# Patient Record
Sex: Female | Born: 1960 | Race: White | Hispanic: No | Marital: Married
Health system: Southern US, Community
[De-identification: ages and names within clinical notes are randomized; demographics above are authoritative.]

---

## 2004-05-06 ENCOUNTER — Ambulatory Visit: Payer: Self-pay

## 2004-05-27 ENCOUNTER — Ambulatory Visit: Payer: Self-pay | Admitting: Unknown Physician Specialty

## 2005-04-15 ENCOUNTER — Ambulatory Visit: Payer: Self-pay | Admitting: Unknown Physician Specialty

## 2005-05-28 ENCOUNTER — Ambulatory Visit: Payer: Self-pay | Admitting: Unknown Physician Specialty

## 2006-03-21 IMAGING — US US PELV - US TRANSVAGINAL
1 series · 17 of 25 positions shown · non-contrast
Comparison: none

REASON FOR EXAM: Globular uterus
COMMENTS:

[Series 1: us pelv - us transvaginal · 17 of 26 slices shown]
[im 1/26]
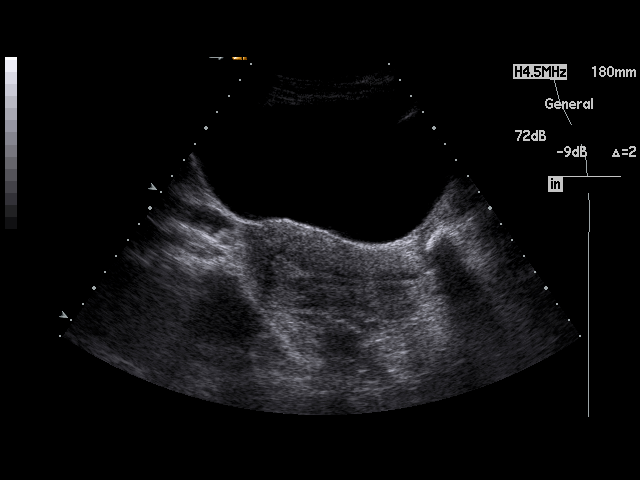
[im 3/26]
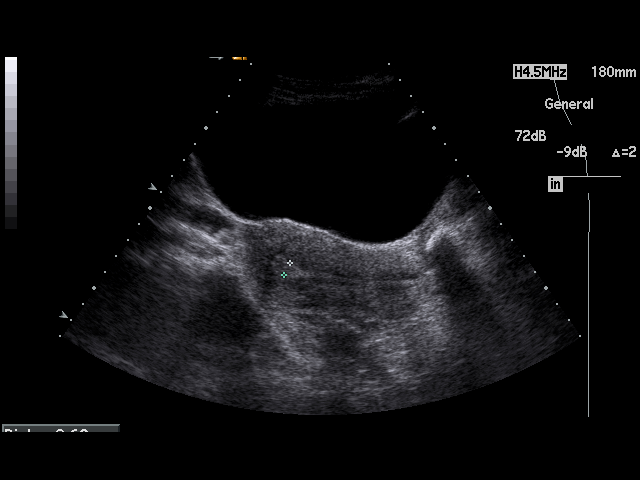
[im 4/26]
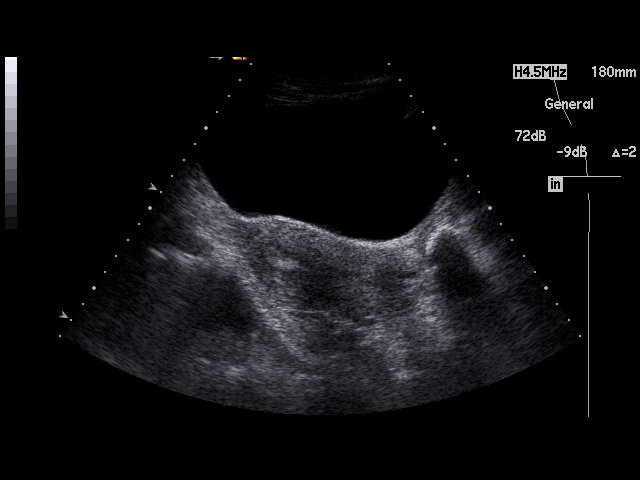
[im 6/26]
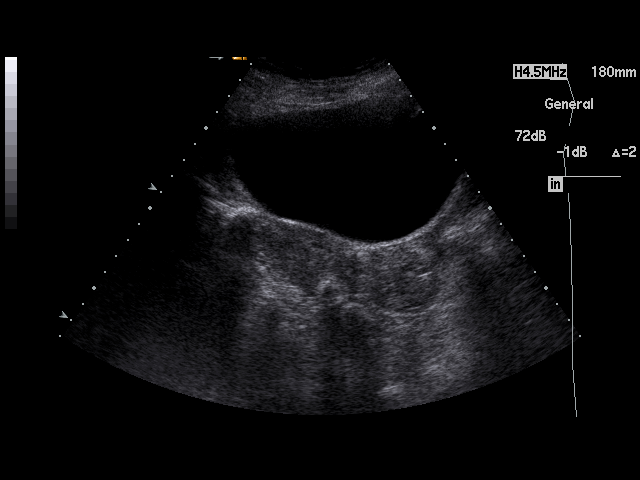
[im 7/26]
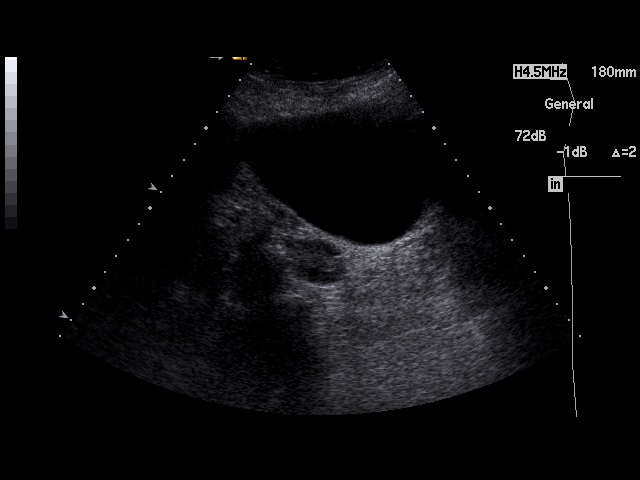
[im 9/26]
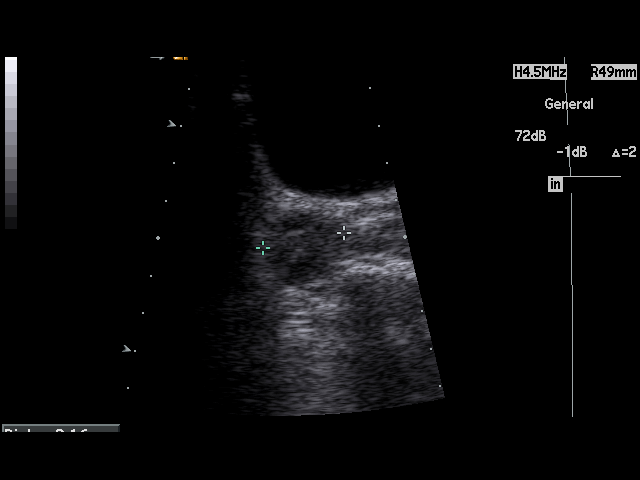
[im 10/26]
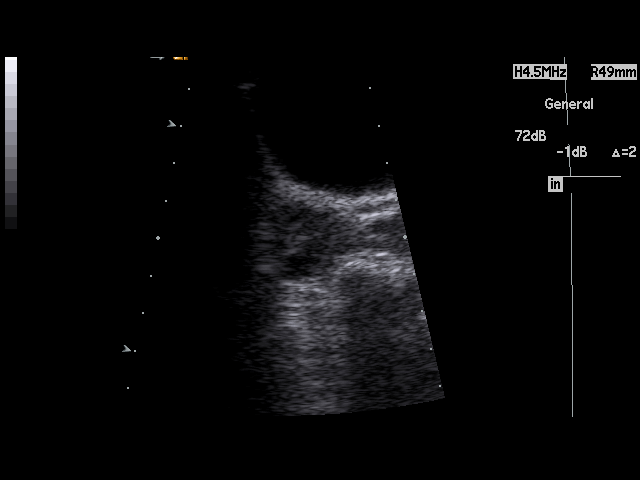
[im 12/26]
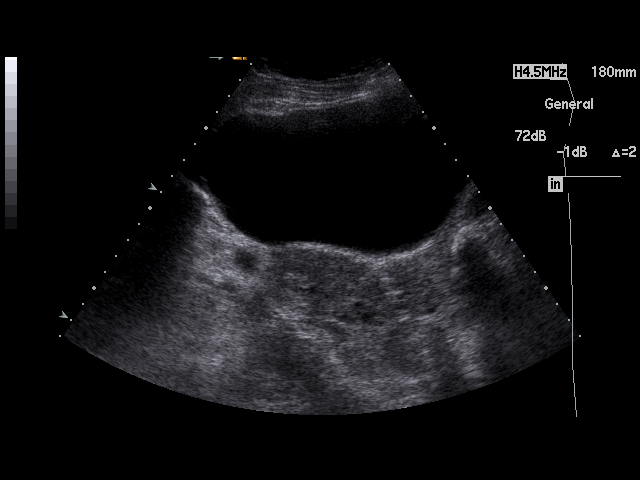
[im 13/26]
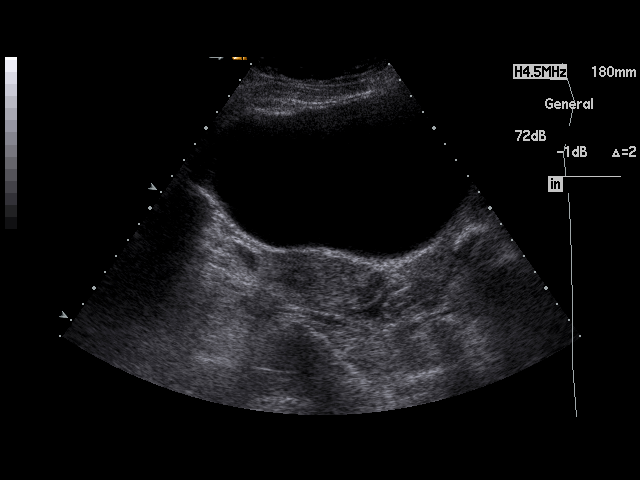
[im 14/26]
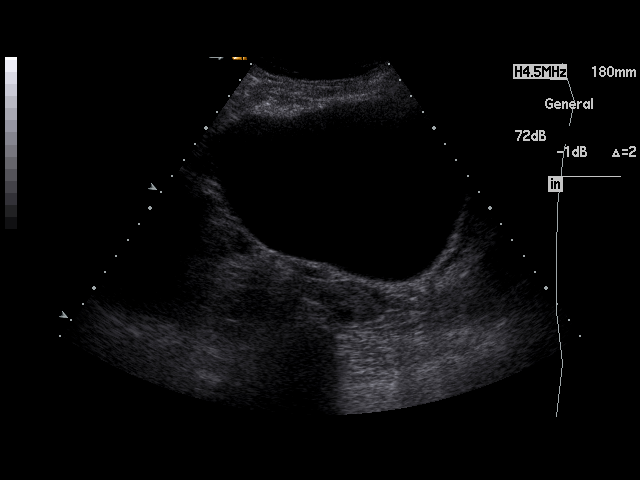
[im 16/26]
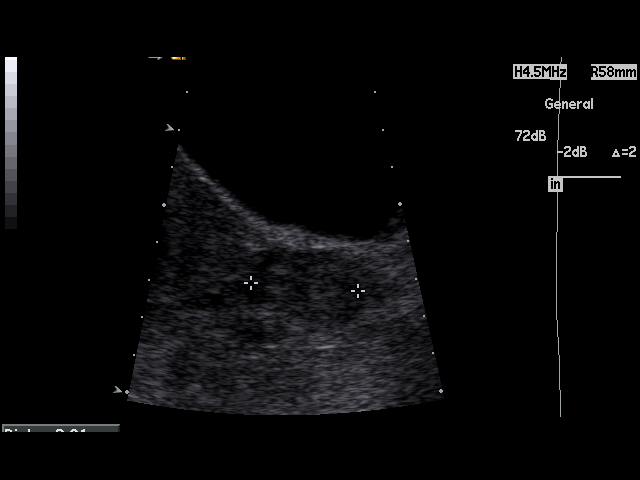
[im 17/26]
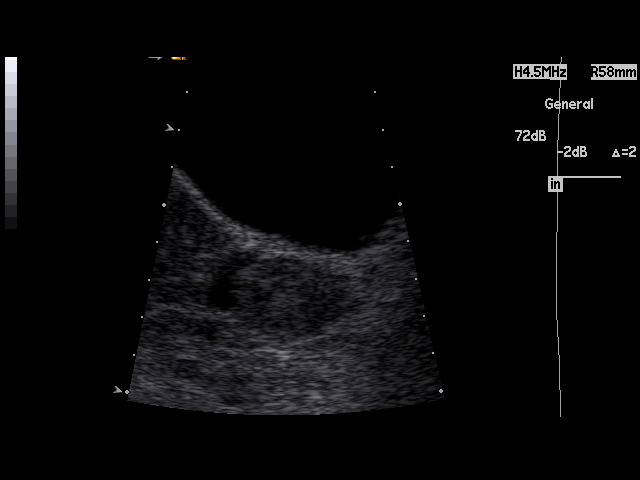
[im 19/26]
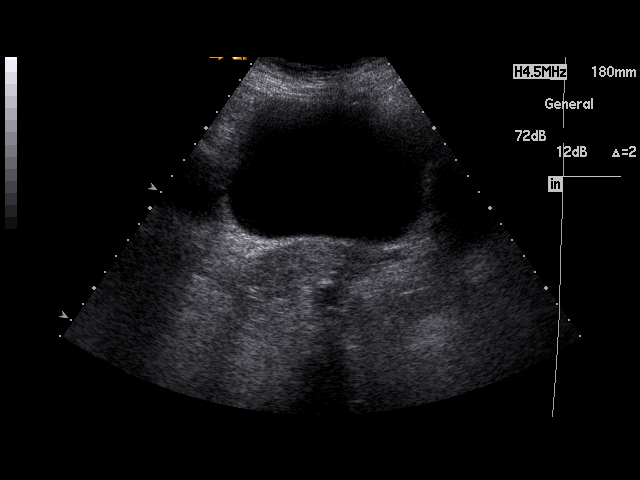
[im 20/26]
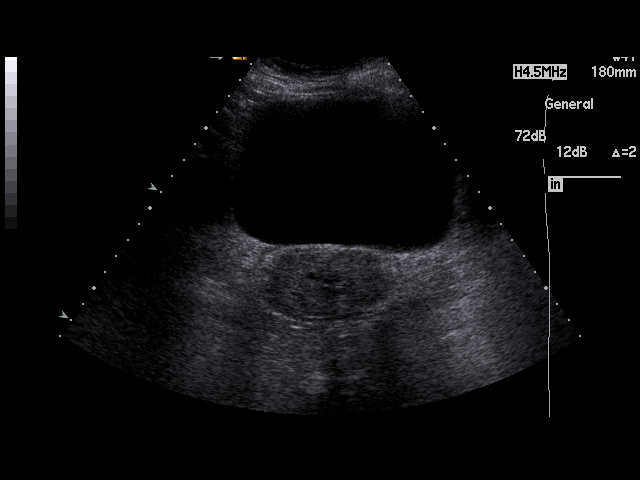
[im 22/26]
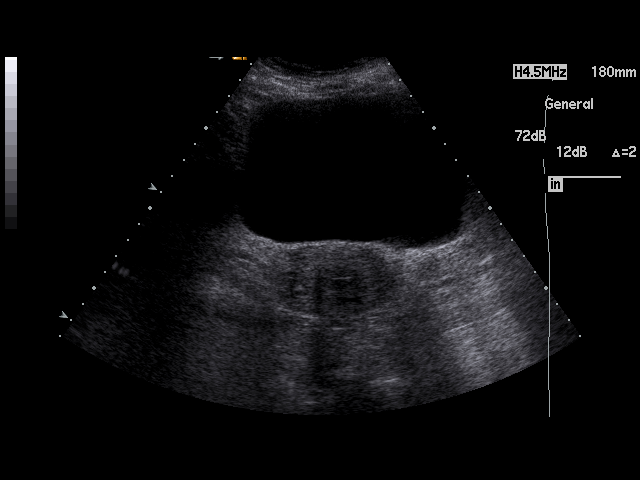
[im 23/26]
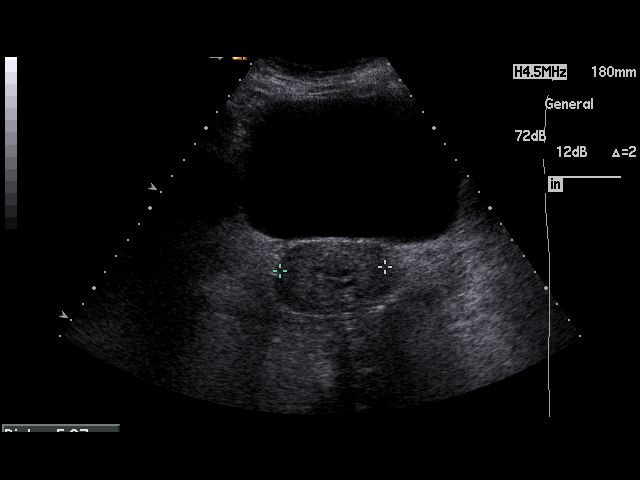
[im 26/26]
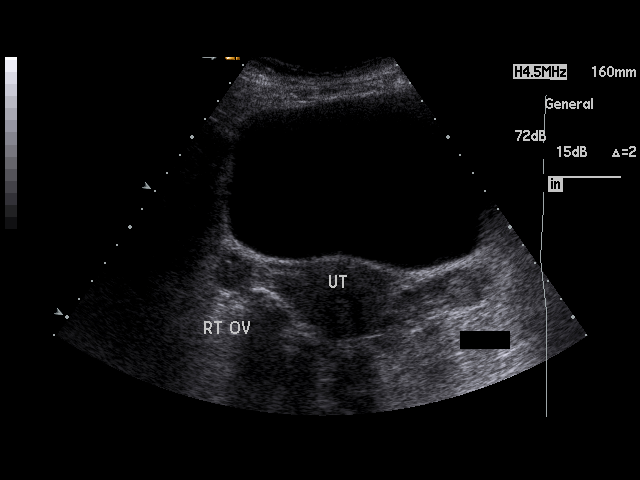

[17 of 25 positions shown; findings below may reference images not displayed]

PROCEDURE:     US  - US PELVIS MASS EXAM  - [DATE] [DATE] [DATE]  [DATE]

RESULT:          The uterus is normal in echotexture and measures
approximately 9 x 4.1 x 5.4 cm.  The endometrial stripe measures
approximately 7 mm.  No abnormal endometrial fluid collections are
identified.  There is no free fluid in the cul-de-sac.

The RIGHT ovary is normal in echotexture and measures 2.8 cm in greatest
dimension.  The LEFT ovary measures approximately 4 cm in greatest dimension
and exhibits developing follicles.
IMPRESSION: The uterus is mildly prominent in size for age, but no
uterine mass is seen.  No abnormal endometrial fluid collections are seen
either.

## 2006-04-26 ENCOUNTER — Ambulatory Visit: Payer: Self-pay

## 2007-04-01 IMAGING — US ULTRASOUND LEFT BREAST
1 series · 11 of 11 positions shown · non-contrast
Comparison: none

REASON FOR EXAM: Left breast mass from areola at 8 o'clock measuring
cm, firm and mobile
COMMENTS:

PROCEDURE:     US  - US BREAST LEFT  - April 26, 2006 [DATE]
RESULT:     Focused Breast Ultrasound was performed from 7 to 9 o'clock
where there is a palpable area clinically.
No masses, solid or cystic, are identified.

[Series 1: ultrasound left breast · 11 of 11 slices shown]
[im 1/11]
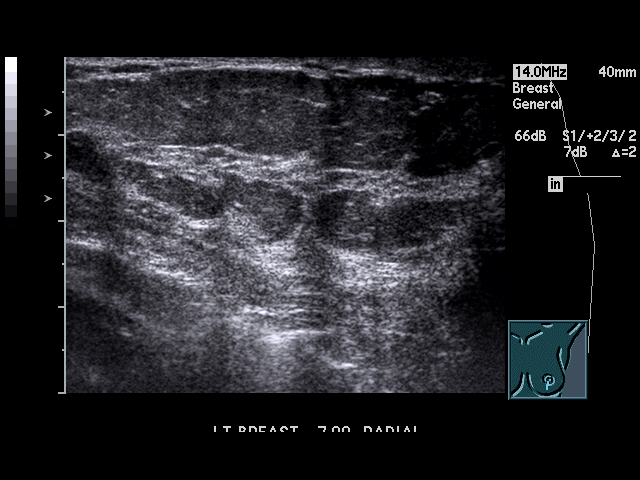
[im 2/11]
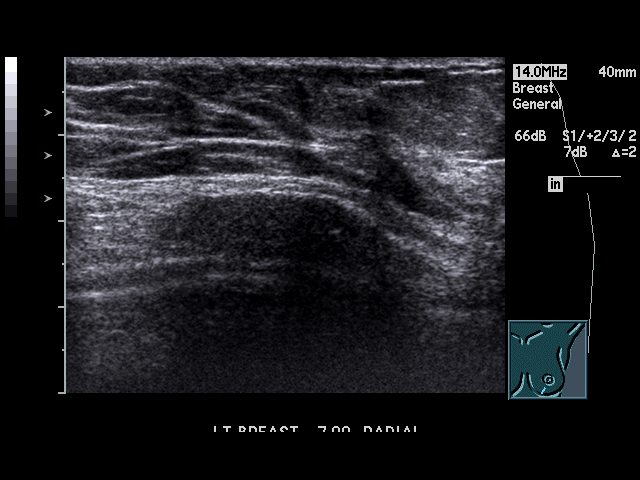
[im 3/11]
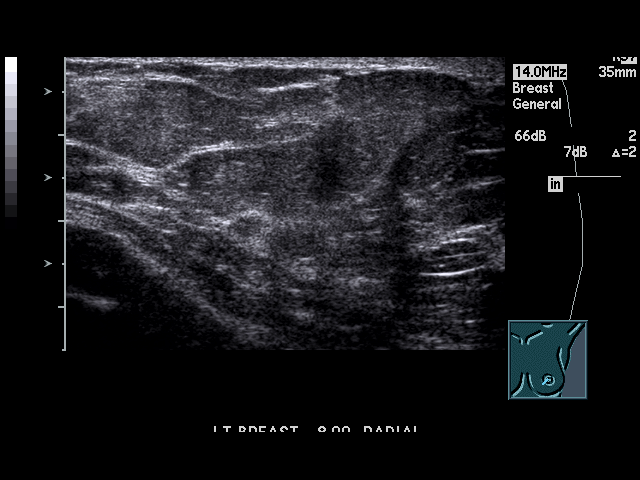
[im 4/11]
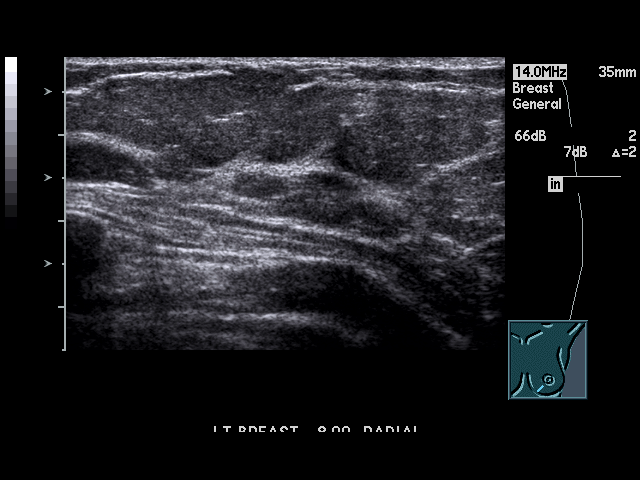
[im 5/11]
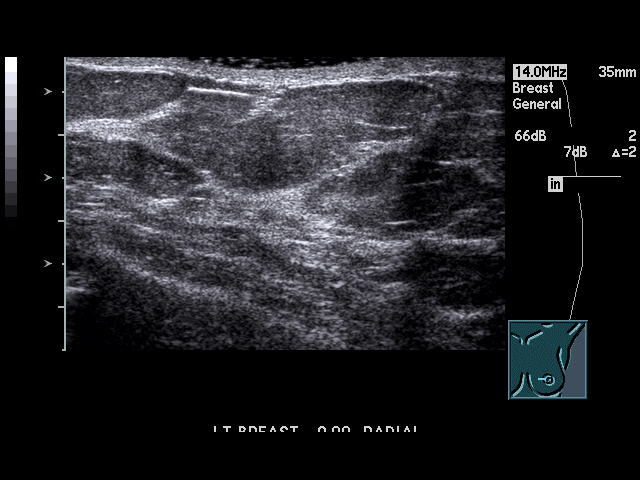
[im 6/11]
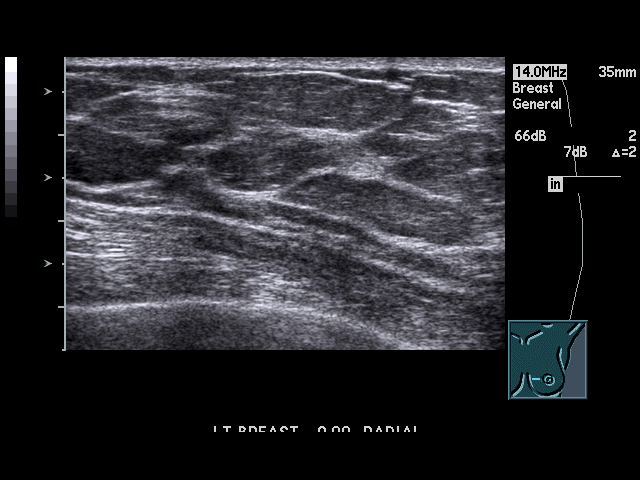
[im 7/11]
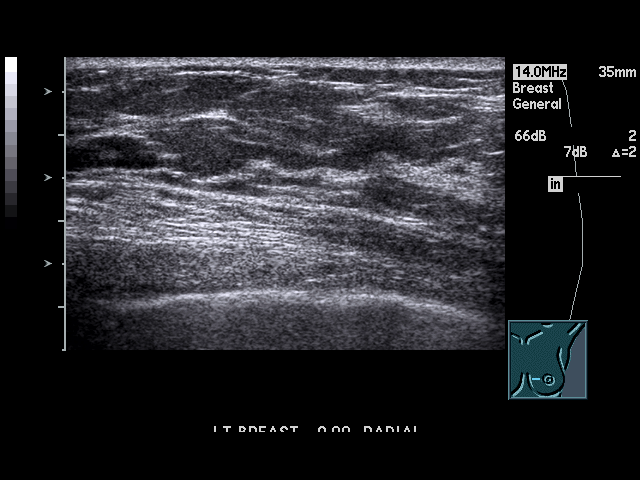
[im 8/11]
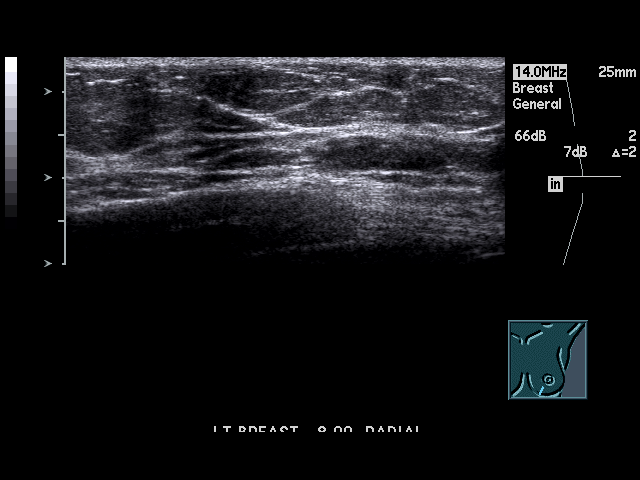
[im 9/11]
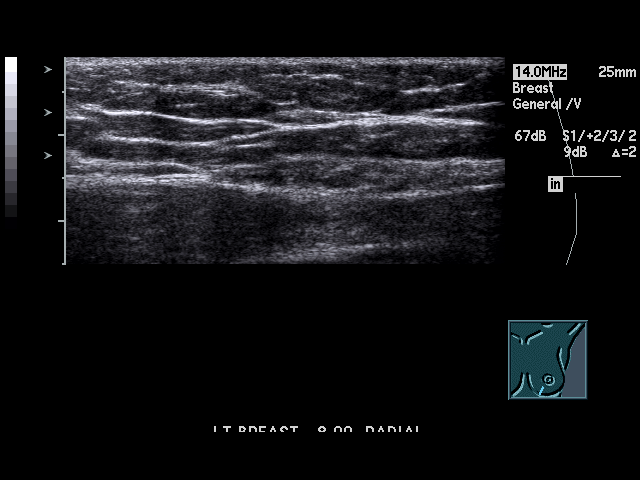
[im 10/11]
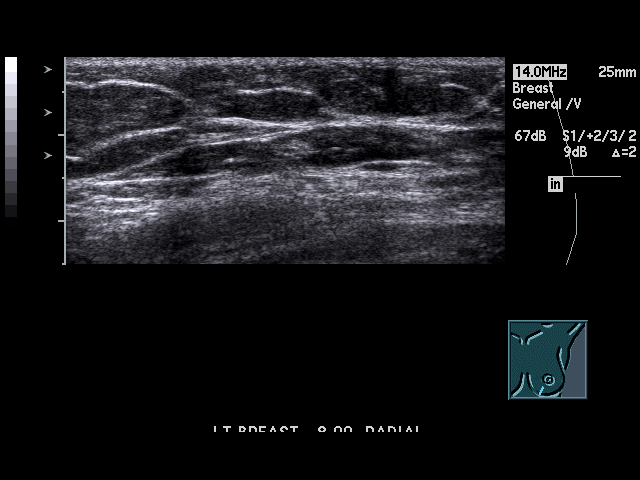
[im 11/11]
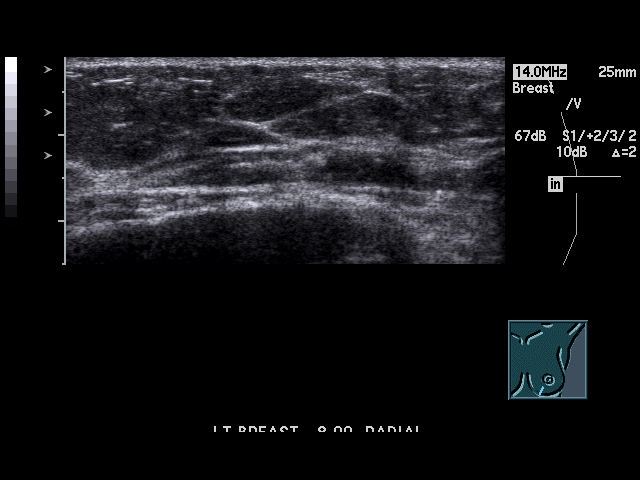

[11 of 11 positions shown; findings below may reference images not displayed]

IMPRESSION: No significant abnormality is noted on this LEFT Breast
Focused Ultrasound.

## 2007-06-21 ENCOUNTER — Ambulatory Visit: Payer: Self-pay | Admitting: Family Medicine

## 2008-06-25 ENCOUNTER — Ambulatory Visit: Payer: Self-pay | Admitting: Family Medicine

## 2009-07-01 ENCOUNTER — Ambulatory Visit: Payer: Self-pay | Admitting: Obstetrics & Gynecology

## 2009-09-23 ENCOUNTER — Ambulatory Visit: Payer: Self-pay | Admitting: Gastroenterology

## 2010-06-24 ENCOUNTER — Ambulatory Visit: Payer: Self-pay | Admitting: Unknown Physician Specialty

## 2010-07-03 ENCOUNTER — Ambulatory Visit: Payer: Self-pay | Admitting: Unknown Physician Specialty

## 2012-06-07 ENCOUNTER — Ambulatory Visit: Payer: Self-pay | Admitting: Obstetrics & Gynecology

## 2012-06-07 LAB — CBC
HCT: 41.6 % (ref 35.0–47.0)
HGB: 14.1 g/dL (ref 12.0–16.0)
MCH: 30.4 pg (ref 26.0–34.0)
Platelet: 218 10*3/uL (ref 150–440)
RDW: 13.6 % (ref 11.5–14.5)
WBC: 6.6 10*3/uL (ref 3.6–11.0)

## 2012-06-14 ENCOUNTER — Inpatient Hospital Stay: Payer: Self-pay | Admitting: Obstetrics & Gynecology

## 2012-06-15 LAB — HEMOGLOBIN: HGB: 11.5 g/dL — ABNORMAL LOW (ref 12.0–16.0)

## 2012-06-16 LAB — PATHOLOGY REPORT

## 2012-08-08 ENCOUNTER — Ambulatory Visit: Payer: Self-pay | Admitting: Urology

## 2012-11-07 ENCOUNTER — Ambulatory Visit: Payer: Self-pay | Admitting: Urology

## 2013-07-14 IMAGING — CR DG IVP HYPERTENSIVE
1 series · 8 of 10 positions shown · non-contrast
Comparison: none

REASON FOR EXAM: Ureteral injury w open wound into cavity
COMMENTS:

[Series 1: scout · 0.17mm/px · 8 of 13 slices shown]
[im 1/13]
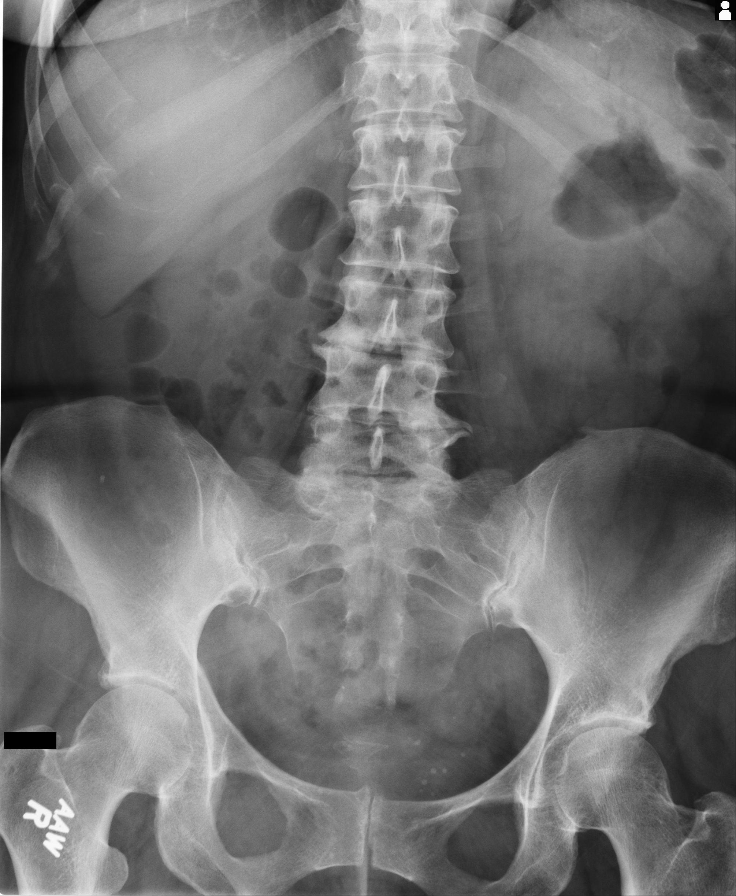
[im 2/13]
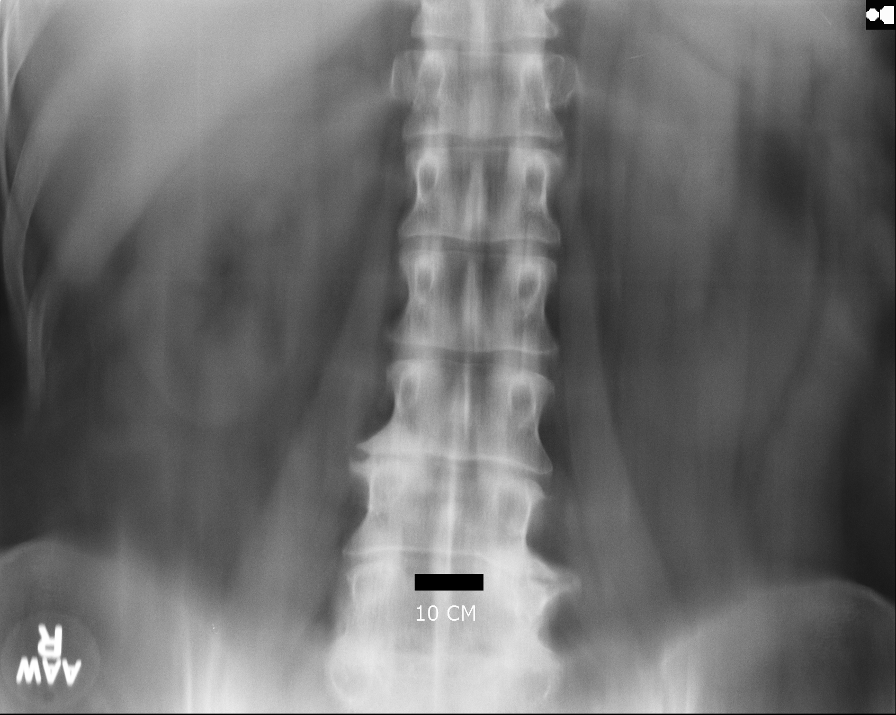
[im 3/13]
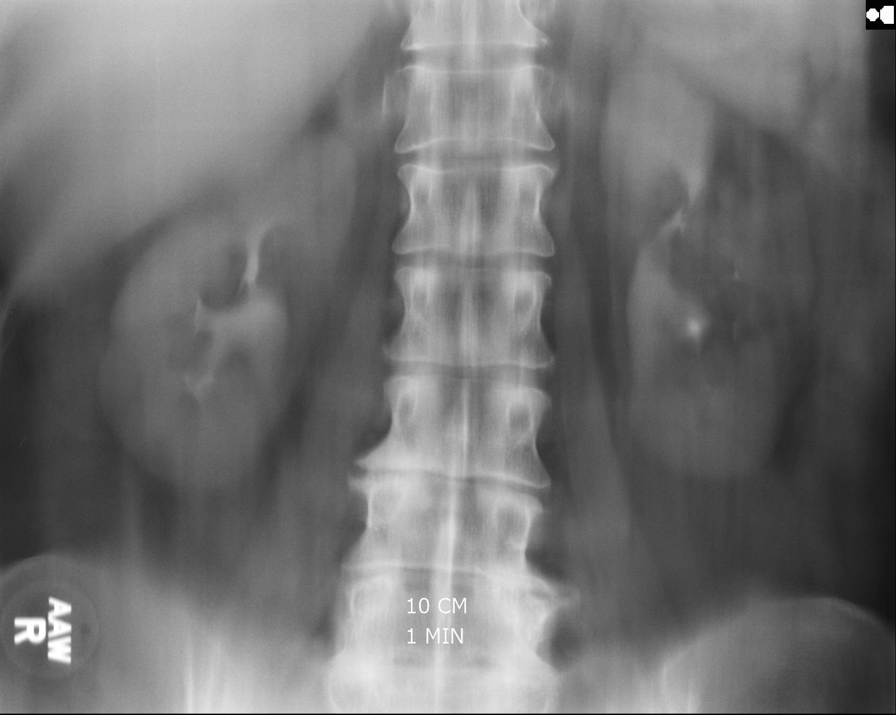
[im 5/13]
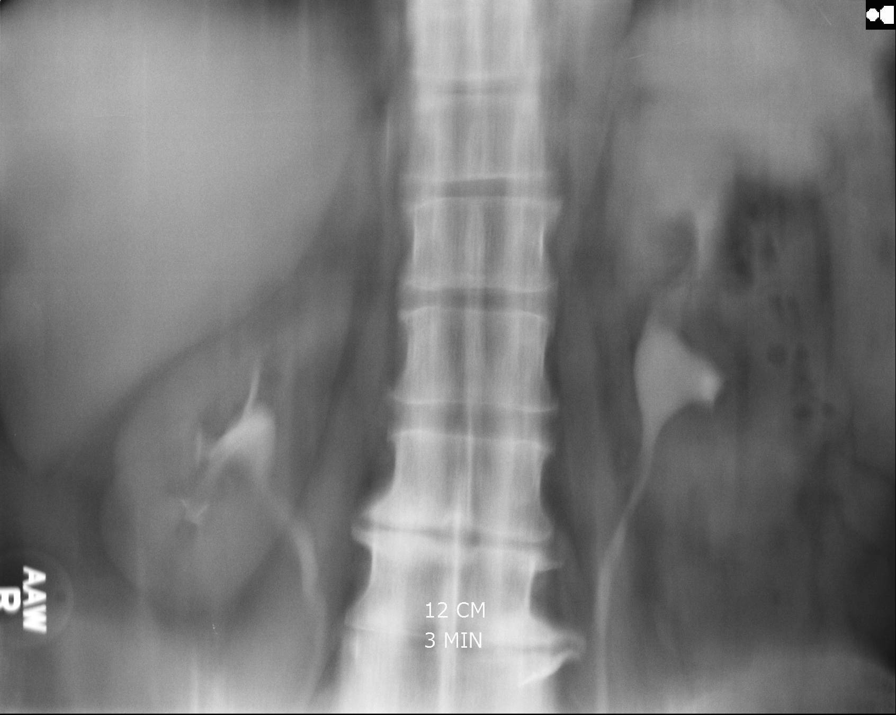
[im 6/13]
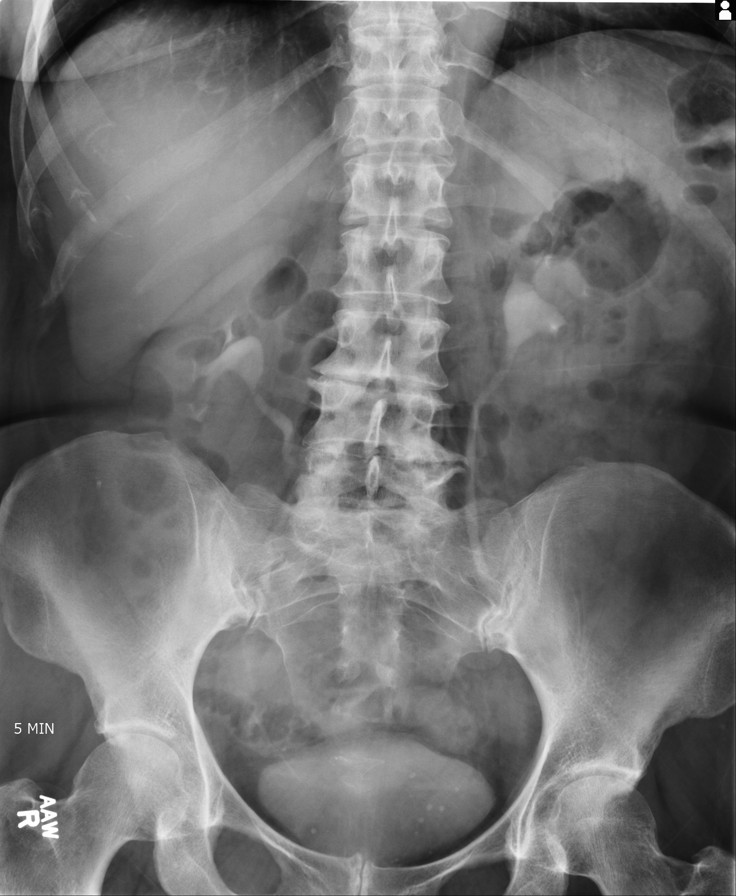
[im 7/13]
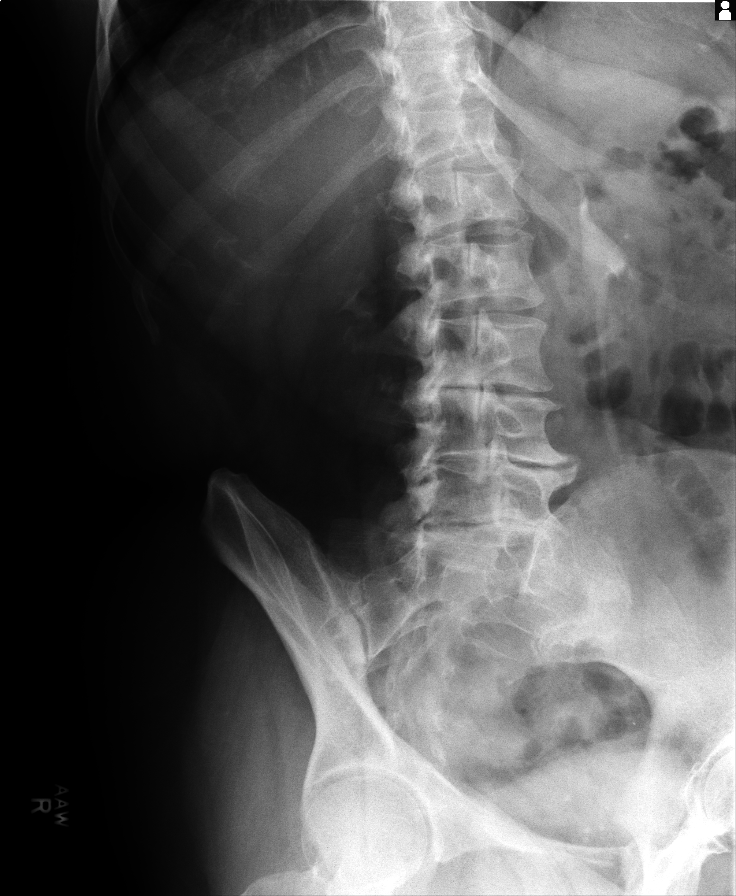
[im 9/13]
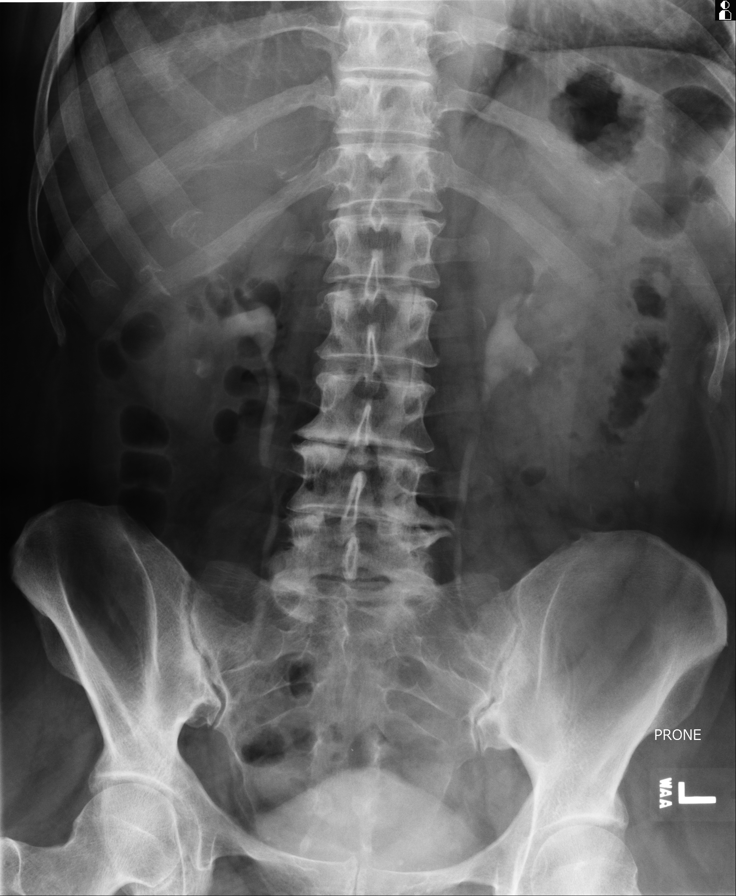
[im 10/13]
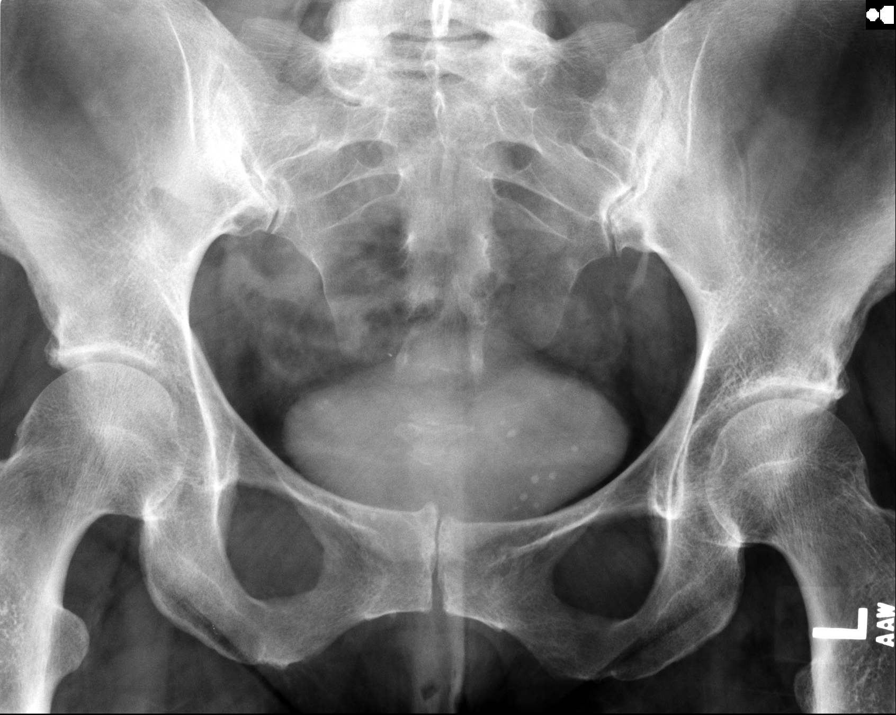

[8 of 10 positions shown; findings below may reference images not displayed]

PROCEDURE:     DXR - DXR INTRAVENOUS UROGRAPHY (IVP)  - August 08, 2012  [DATE]

RESULT:     Standard IVP obtained with 50 cc of Isovue. Phleboliths are
present in the pelvis. Symmetric bilateral renal function is present. Renal
contour and size  normal. Renal collecting system and ureters normal. No
evidence of obstruction or contrast leakage. The bladder is normal. Post
void residual normal.
IMPRESSION: Normal exam.

## 2014-05-25 NOTE — Op Note (Signed)
PATIENT NAME:  Tammy Watson, Tammy Watson MR#:  [id number] DATE OF BIRTH:  Oct 12, 1960  DATE OF PROCEDURE:  06/14/2012  PRINCIPAL DIAGNOSIS: Transected left ureter.   POSTOPERATIVE DIAGNOSIS:  Transected left ureter.   PROCEDURE: Cystoscopy with left ureteral instrumentation, left ureteral reimplantation.   INDICATIONS: The patient is a 54 year old white female undergoing a hysterectomy under the direction of Dr. Tiburcio Pea. Significant adhesion to the left pelvic sidewall was encountered at the end of the procedure. Cystoscopy was performed demonstrating good efflux of methylene blue from the right ureteral orifice. No efflux was noted from the left. Intraoperative urological consultation was requested.   PROCEDURE:  As part of the ongoing procedure, cystoscopy was then performed by myself with a 22 French rigid cystoscope. No significant urethral or bladder abnormalities were noted. Bilateral ureteral orifices were well visualized with no lesions noted. A flexible tip Glidewire was introduced into the left ureteral orifice without difficulty. It was inserted up the ureter without resistance. The laparoscope was present within the abdominal compartment. The guidewire was noted extending from the pelvis into the peritoneal cavity. This is consistent with a ureteral transection. The decision was made to proceed with open exploration. The Bookwalter retractor was placed. The patient had had a previous Pfannenstiel incision. The same incision site was opened with some extension to the lateral aspect on both sides. The fascia was identified. The midline was opened to just below the umbilicus. The Bookwalter retractor was placed, opening the abdominal cavity. The site of the surgical instrumentation on the left pelvic sidewall was identified. An opening was made in the retroperitoneum along the left side wall. A structure was noted to be very prominent that appeared to be consistent with the ureter. This was initially  dissected free. As it was dissected, it became clear that this was the gonadal vein. Additional dissection was then performed in the retroperitoneal space. The iliac artery could be easily identified. The ureter was then identified, which was noted to be dilated, consistent with transection and occlusion. The ureter was dissected free at this point. A vessel loop was placed underneath. Additional dissection was then undertaken more distal to the level of transection. This was at a site of previous surgical bed. Complete occlusion of the ureter was identified. The ureter was then dissected more proximal, freeing  a more than adequate section of the ureter for reimplantation. Care was taken to maintain as much periureteral tissue as possible. Once the ureter was freed, a 4-0 Vicryl suture was placed on its distal aspect approximately 3 mm proximal. The ureter was completely transected with a large amount of urine efflux under pressure. Good tissue was noted at this point. Bleeding was noted from the cut edges. The ureter was then spatulated approximately 1 cm in length. A second 4-0 Vicryl suture was placed at the tip of the spatulated opening. The bladder was then dissected free. It was opened on its anterior surface. An appropriate site for reimplantation was identified within the left posterior bladder. A right angle clamp was utilized to make an opening through the bladder wall. The segment of Vicryl suture at the apices of the cut ureter was brought through into the bladder. The ureter was easily pulled through into the urinary bladder. More than adequate distance was obtained for tension-free reanastomosis. The mucosa was dissected free, approximately 1 cm below the implantation site. The apex of the ureter was secured at its distalmost aspect with one of the 4-0 Vicryl sutures previously placed. The spatulated apex was  then secured at the more superior aspect of bladder site. The 4-0 Vicryl sutures were then  utilized to secure the spatulated ureter to the surrounding mucosa. The bites were placed as deep as possible to obtain some muscle, especially at the 6 o'clock and 12 o'clock positions. Good opening and spatulation of the ureter was visualized. Good urine efflux was noted throughout the procedure. Once the anastomosis was complete, a guidewire was advanced into the ureter without difficulty. A 6 French x 24 cm double-J ureteral stent was advanced over the guidewire into the upper pole system without difficulty. Adequate curl was noted within the bladder. Upon withdrawal of the guidewire, good urine efflux was noted through the stent. Additional 4-0 Vicryl sutures were placed in the periureteral tissue to the posterior aspect of the bladder. Additional ureteral tissue was present with no tension whatsoever on the reanastomosis. The bladder was then closed in two layers utilizing 2-0 Vicryl suture. The peritoneum was closed utilizing a 2-0 chromic suture. The fascia was then closed utilizing a 0 Prolene suture in a running fashion. Two separate sutures were utilized. The separated abdominal tissue was secured to the fascia utilizing a 2-0 Vicryl suture in a running fashion. The wound was irrigated with ample amounts of sterile saline. The skin was then closed utilizing staples. The port sites were then addressed by Dr. Tiburcio PeaHarris in the standard fashion. The previously placed 16 French Foley catheter was replaced with a 22 French Foley catheter in the event of bleeding from the anastomosis site. The patient tolerated the procedure well. There were no problems or complications with the reimplantation portion of the procedure. The remainder of the procedure will be dictated by Dr. Tiburcio PeaHarris under a separate operative note.   Estimated blood loss for the reimplantation portion was approximately 50 mL.  ____________________________ Tammy FriezeBrian S. Achilles Dunkope, MD bsc:cc D: 06/14/2012 21:29:00 ET T: 06/14/2012 21:59:48  ET  JOB#: [id number]  cc: Dierdre Searles. Paul Harris, MD Tammy FriezeBRIAN S Janisse Ghan MD ELECTRONICALLY SIGNED 06/30/2012 11:31

## 2014-05-25 NOTE — Op Note (Signed)
PATIENT NAME:  Tammy Watson, Posie J MR#:  [id number] DATE OF BIRTH:  Mar 27, 1960  DATE OF PROCEDURE:   06/14/2012  PREOPERATIVE DIAGNOSIS:  Menorrhagia.   POSTOPERATIVE DIAGNOSIS:  Menorrhagia.  PROCEDURE:   Complete laparoscopic hysterectomy with bilateral salpingo-oophorectomy and cystoscopy.   SURGEON:  Annamarie MajorPaul Yekaterina Escutia, M.D.   ASSISTANT:  None.   ANESTHESIA:  General.   BLOOD LOSS:  150 mL.   COMPLICATIONS:  Left ureter incision.   FINDINGS:  Enlarged uterus, adhesions in lower uterine segment.   SPECIMEN TYPE:  Uterus, right tube and ovary, left tube and ovary.  COMMENT:  At the conclusion of the hysterectomy, cystoscopy was performed with indigo carmine injected IV. The patient had patent uretal flow on the right, but no patency on the left. Dr. Achilles Dunkope of Urology was called, who has a separate dictation of his procedure at that point in time.   DISPOSITION:  To recovery room in stable condition.   TECHNIQUE: The patient is prepped and draped in the usual sterile fashion. After adequate anesthesia is obtained in the dorsal lithotomy position, Foley catheter is inserted. Cervix is identified on speculum exam, and a V-Care device is placed for manipulation purposes.   Attention is then turned to the abdomen, where a Veress needle is inserted through a 5 mm infraumbilical incision after Marcaine is used to anesthetize the skin. Veress needle placement is confirmed using the hanging drop technique, and the abdomen is then insufflated with CO2 gas. A 5 mm trocar is then inserted under direct visualization with the laparoscope, with no injuries or bleeding noted. Patient is placed in Trendelenburg position, and the above findings are visualized.   A 5 mm trocar is placed in the left lower quadrant, and an 11 mm trocar is placed in the right lower quadrant lateral to the inferior epigastric blood vessels, with no injuries or bleeding noted. The uterus is grasped with a tenaculum, and the  infundibulopelvic blood vessels and ligaments are carefully coagulated and cut using a 5 mm Harmonic scalpel. Ureter observed on both sides at this time to be out of harm's way. The dissection is carried down to the level of the round ligaments, which are carefully dissected as well with the Harmonic scalpel. Dissection is carried down to the level of the uterine arteries, which are carefully coagulated using a bipolar cautery device. The V-Care device is identified tenting through the cervical-vaginal junction. Adhesions are taken down on the right and the left side as well as in the lower uterine segment on the anterior surface to adequately expose the V-Care device. An incision is made with the Harmonic scalpel to make a circumferential dissection and amputation of the uterus and cervix. Once this is completely finished, it is removed vaginally. A sponge is placed in the vagina at this time.   The vaginal cuff is closed with interrupted Polysorb sutures using the Endo Stitch device. Six sutures are applied with adequate closure both visually and by bimanual examination vaginally. The pelvic cavity is irrigated, with aspiration of all fluid. Next, hemostasis is noted.   Cystoscopy is performed with saline distention of the bladder. Indigo carmine is injected through the IV. Blue dye is seen to emit through the right uretal orifice, but is not seen to emit through the left uretal orifice. Cystoscope is removed. Laparoscopy is performed. Repeat examination of the left side ureter appears to be intact down to the level of the bladder. At this point, it is hard to see due to the  dissection and adhesions at this point. Urology arrives at this time to perform cystoscopy, stenting, and further management. It is at this time that they see that the ureter actually had been incised and would need to be reimplanted. Please see separate dictation for this part of the case.   Once this has been completed through a  laparotomy incision, it is closed as seen in the prior dictation. Skin is closed with dissolvable staples and Dermabond. The laparoscopic sites are also closed with Dermabond. The patient goes to recovery room with a 22-French catheter in place in her bladder. All sponge, instrument and needle counts are correct.     ____________________________ R. Annamarie Major, MD rph:mr D: 06/14/2012 17:25:11 ET T: 06/14/2012 19:37:25 ET JOB#: [id number]  cc: Dierdre Searles, MD, <Dictator> Nadara Mustard MD ELECTRONICALLY SIGNED 06/15/2012 15:25
